# Patient Record
Sex: Female | Born: 2000 | Race: Black or African American | Marital: Single | Smoking: Never smoker
Health system: Southern US, Community
[De-identification: ages and names within clinical notes are randomized; demographics above are authoritative.]

---
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
## 2020-02-22 ENCOUNTER — Ambulatory Visit: Payer: Self-pay | Admitting: Family Medicine

## 2020-02-25 ENCOUNTER — Ambulatory Visit: Payer: Self-pay | Admitting: Family Medicine

## 2020-02-29 ENCOUNTER — Ambulatory Visit: Payer: Self-pay

## 2020-02-29 ENCOUNTER — Other Ambulatory Visit: Payer: Self-pay

## 2020-02-29 ENCOUNTER — Encounter: Payer: Self-pay | Admitting: Family Medicine

## 2020-02-29 ENCOUNTER — Ambulatory Visit (INDEPENDENT_AMBULATORY_CARE_PROVIDER_SITE_OTHER): Payer: Commercial Managed Care - PPO | Admitting: Family Medicine

## 2020-02-29 DIAGNOSIS — G8929 Other chronic pain: Secondary | ICD-10-CM

## 2020-02-29 DIAGNOSIS — M25562 Pain in left knee: Secondary | ICD-10-CM

## 2020-02-29 DIAGNOSIS — M25561 Pain in right knee: Secondary | ICD-10-CM | POA: Diagnosis not present

## 2020-02-29 MED ORDER — MELOXICAM 15 MG PO TABS: 7.5000 mg | ORAL_TABLET | Freq: Every day | ORAL | 6 refills | Status: DC | PRN

## 2020-02-29 NOTE — Progress Notes (Signed)
No known injury Pain for a long time Use to play sports Started hurting when working at [employer] with PT but still hurting  Pain is getting worse

## 2020-02-29 NOTE — Progress Notes (Signed)
   Office Visit Note   Patient: Cheryl Shaw           Date of Birth: 31-May-2001           MRN: [id number] Visit Date: 02/29/2020 Requested by: No referring provider defined for this encounter. PCP: No primary care provider on file.  Subjective: Chief Complaint  Patient presents with  . Right Knee - Pain  . Left Knee - Pain    HPI: She is here with bilateral knee pain.  Symptoms started years ago.  She has always been active playing sports, she presumed that it was not normal to have knee pain during sports.  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  Even though she is not active in sports, her knees continue to bother her.  [place] is originally from [place] and over the summer she went to physical therapy for a few months.  Her pain improved but it did not go away completely.  Now both knees are bothering her again.  Her PCP gave her an anti-inflammatory for a month and that gave her temporary relief.  Both of her knees hurt about the same, across the front of the knee.  No locking or giving way, no sensation of instability.  She is studying biology in hopes of either going to medical school or nursing school.                ROS:   All other systems were reviewed and are negative.  Objective: Vital Signs: There were no vitals taken for this visit.  Physical Exam:  General:  Alert and oriented, in no acute distress. Pulm:  Breathing unlabored. Psy:  Normal mood, congruent affect. Skin: No rash Knees: She has 1+ patellofemoral crepitus on the left, none on the right.  No effusion in either knee.  Bilateral wide Q angles with slight pain on patellar compression.  Lachman's is solid bilaterally, no laxity with varus or valgus stress, no significant joint line tenderness and no pain or click with McMurray's.  Imaging: XR Knee 1-2 Views Left  Result Date: 02/29/2020 X-rays of the knees reveal normal bony anatomy, close growth plates, no sign of stress fracture, loose body,  OCD.  XR Knee 1-2 Views Right  Result Date: 02/29/2020 X-rays of the knees reveal normal bony anatomy, close growth plates, no sign of stress fracture, loose body, OCD.   Assessment & Plan: 1.  Chronic bilateral knee pain, question chondromalacia patella. -She has continued to have pain despite adequate conservative treatment.  We will proceed with MRI scan of both knees.  Depending on the results, could consider different bracing, resumption of physical therapy, possibly injections such as dextrose prolotherapy or gel injections.     Procedures: No procedures performed  No notes on file     PMFS History: There are no problems to display for this patient.  History reviewed. No pertinent past medical history.  History reviewed. No pertinent family history.  History reviewed. No pertinent surgical history. Social History   Occupational History  . Not on file  Tobacco Use  . Smoking status: Not on file  Substance and Sexual Activity  . Alcohol use: Not on file  . Drug use: Not on file  . Sexual activity: Not on file

## 2020-03-17 ENCOUNTER — Other Ambulatory Visit: Payer: Self-pay | Admitting: Family Medicine

## 2020-03-17 ENCOUNTER — Encounter: Payer: Self-pay | Admitting: Family Medicine

## 2020-03-22 ENCOUNTER — Other Ambulatory Visit: Payer: BLUE CROSS/BLUE SHIELD

## 2020-04-12 ENCOUNTER — Ambulatory Visit
Admission: RE | Admit: 2020-04-12 | Discharge: 2020-04-12 | Disposition: A | Discharge status: Home / Self Care | Payer: BC Managed Care – PPO | Source: Ambulatory Visit | Attending: Family Medicine | Admitting: Family Medicine

## 2020-04-12 ENCOUNTER — Other Ambulatory Visit: Payer: Self-pay

## 2020-04-12 DIAGNOSIS — G8929 Other chronic pain: Secondary | ICD-10-CM

## 2020-04-12 DIAGNOSIS — M25562 Pain in left knee: Secondary | ICD-10-CM

## 2020-04-14 ENCOUNTER — Telehealth: Payer: Self-pay | Admitting: Family Medicine

## 2020-04-14 NOTE — Telephone Encounter (Signed)
MRI scans look good, no sign of internal damage that would need surgery at this point.    Could contemplate trying different braces during activity.  [email or web address]

## 2020-04-24 ENCOUNTER — Ambulatory Visit (INDEPENDENT_AMBULATORY_CARE_PROVIDER_SITE_OTHER): Payer: Commercial Managed Care - PPO

## 2020-04-24 ENCOUNTER — Other Ambulatory Visit: Payer: Self-pay

## 2020-04-24 DIAGNOSIS — M25562 Pain in left knee: Secondary | ICD-10-CM

## 2020-04-24 DIAGNOSIS — M25561 Pain in right knee: Secondary | ICD-10-CM

## 2020-04-24 DIAGNOSIS — G8929 Other chronic pain: Secondary | ICD-10-CM

## 2020-04-24 NOTE — Progress Notes (Signed)
Nurse visit only: fitted patient with bilateral pso braces, size large.

## 2020-07-29 ENCOUNTER — Encounter: Payer: Self-pay | Admitting: Family Medicine

## 2020-09-10 ENCOUNTER — Encounter: Payer: Self-pay | Admitting: Family Medicine

## 2020-09-12 ENCOUNTER — Ambulatory Visit: Payer: Commercial Managed Care - PPO | Admitting: Family Medicine

## 2021-01-24 ENCOUNTER — Ambulatory Visit (HOSPITAL_COMMUNITY)
Admission: EM | Admit: 2021-01-24 | Discharge: 2021-01-24 | Disposition: A | Discharge status: Home / Self Care | Payer: Commercial Managed Care - PPO | Attending: Emergency Medicine | Admitting: Emergency Medicine

## 2021-01-24 ENCOUNTER — Encounter (HOSPITAL_COMMUNITY): Payer: Self-pay | Admitting: Emergency Medicine

## 2021-01-24 DIAGNOSIS — N898 Other specified noninflammatory disorders of vagina: Secondary | ICD-10-CM | POA: Insufficient documentation

## 2021-01-24 MED ORDER — FLUCONAZOLE 150 MG PO TABS: 150.0000 mg | ORAL_TABLET | Freq: Every day | ORAL | 0 refills | Status: DC

## 2021-01-24 NOTE — ED Triage Notes (Signed)
Pt presents with vaginal irritation and discharge Xs 2 days.

## 2021-01-24 NOTE — ED Provider Notes (Signed)
[facility]    CSN: [id number] Arrival date & time: 01/24/21  1634      History   Chief Complaint Chief Complaint  Patient presents with   Vaginal Itching   Vaginal Discharge    HPI Cheryl Shaw is a 20 y.o. female.   Patient presents with vaginal itching/irritation with thick Hendry Speas clumpy discharge for 2 days.  Denies urinary frequency, urgency, flank pain, abdominal pain, fever, chills, hematuria, new lesions, rash, vaginal odor.  Sexually active, 1 partner, always condom use.  No known exposure.  Has not attempted treatment.  History reviewed. No pertinent past medical history.  There are no problems to display for this patient.   History reviewed. No pertinent surgical history.  OB History   No obstetric history on file.      Home Medications    Prior to Admission medications   Medication Sig Start Date End Date Taking? Authorizing Provider  fluconazole (DIFLUCAN) 150 MG tablet Take 1 tablet (150 mg total) by mouth daily. 01/24/21  Yes Costas Sena R, NP  LO LOESTRIN FE 1 MG-10 MCG / 10 MCG tablet Take 1 tablet by mouth daily. 09/07/19   [provider]  meloxicam (MOBIC) 15 MG tablet Take 0.5-1 tablets (7.5-15 mg total) by mouth daily as needed for pain. 02/29/20   Hilts, Casimiro Needle, MD    Family History History reviewed. No pertinent family history.  Social History Social History   Tobacco Use   Smoking status: Never   Smokeless tobacco: Never  Substance Use Topics   Alcohol use: Not Currently   Drug use: Never     Allergies   Patient has no allergy information on record.   Review of Systems Review of Systems  Constitutional: Negative.   Respiratory: Negative.    Cardiovascular: Negative.   Genitourinary:  Positive for vaginal discharge. Negative for decreased urine volume, difficulty urinating, dyspareunia, dysuria, enuresis, flank pain, frequency, genital sores, hematuria, menstrual problem, pelvic pain, urgency, vaginal  bleeding and vaginal pain.  Skin: Negative.     Physical Exam Triage Vital Signs ED Triage Vitals [01/24/21 1652]  Enc Vitals Group     BP 116/60     Pulse Rate 60     Resp 16     Temp 98.7 F (37.1 C)     Temp Source Oral     SpO2 100 %     Weight      Height      Head Circumference      Peak Flow      Pain Score 0     Pain Loc      Pain Edu?      Excl. in GC?    No data found.  Updated Vital Signs BP 116/60 (BP Location: Right Arm)   Pulse 60   Temp 98.7 F (37.1 C) (Oral)   Resp 16   LMP 12/29/2020   SpO2 100%   Visual Acuity Right Eye Distance:   Left Eye Distance:   Bilateral Distance:    Right Eye Near:   Left Eye Near:    Bilateral Near:     Physical Exam Constitutional:      Appearance: Normal appearance. She is normal weight.  Eyes:     Extraocular Movements: Extraocular movements intact.  Pulmonary:     Effort: Pulmonary effort is normal.  Genitourinary:    Comments: Deferred self collected vaginal swab Neurological:     Mental Status: She is alert and oriented to person, place,  and time. Mental status is at baseline.  Psychiatric:        Mood and Affect: Mood normal.        Behavior: Behavior normal.     UC Treatments / Results  Labs (all labs ordered are listed, but only abnormal results are displayed) Labs Reviewed  CERVICOVAGINAL ANCILLARY ONLY    EKG   Radiology No results found.  Procedures Procedures (including critical care time)  Medications Ordered in UC Medications - No data to display  Initial Impression / Assessment and Plan / UC Course  I have reviewed the triage vital signs and the nursing notes.  Pertinent labs & imaging results that were available during my care of the patient were reviewed by me and considered in my medical decision making (see chart for details).  Vaginal discharge  Will treat prophylactically for possible yeast infection   1.  Diflucan 150 mg once, Diflucan 250 mg as needed in 72  hours 2.  STI screening pending, will treat per protocol, advised abstinence until all treatment complete and or symptoms are resolved Final Clinical Impressions(s) / UC Diagnoses   Final diagnoses:  Vaginal discharge     Discharge Instructions      Labs pending 2-3 days, you will be contacted if positive for any sti and treatment will be sent to the pharmacy, you will have to return to the clinic if positive for gonorrhea to receive treatment   Please refrain from having sex until labs results, if positive please refrain from having sex until treatment complete and symptoms resolve   If positive for Chlamydia  gonorrhea or trichomoniasis please notify partner or partners so they may tested as well  Moving forward, it is recommended you use some form of protection against the transmission of sti infections  such as condoms or dental dams with each sexual encounter     ED Prescriptions     Medication Sig Dispense Auth. Provider   fluconazole (DIFLUCAN) 150 MG tablet Take 1 tablet (150 mg total) by mouth daily. 2 tablet Valinda Hoar, NP      PDMP not reviewed this encounter.   Valinda Hoar, NP 01/24/21 1740

## 2021-01-24 NOTE — Discharge Instructions (Addendum)
Labs pending 2-3 days, you will be contacted if positive for any sti and treatment will be sent to the pharmacy, you will have to return to the clinic if positive for gonorrhea to receive treatment   Please refrain from having sex until labs results, if positive please refrain from having sex until treatment complete and symptoms resolve   If positive for  Chlamydia  gonorrhea or trichomoniasis please notify partner or partners so they may tested as well  Moving forward, it is recommended you use some form of protection against the transmission of sti infections  such as condoms or dental dams with each sexual encounter    

## 2021-01-26 LAB — CERVICOVAGINAL ANCILLARY ONLY
Bacterial Vaginitis (gardnerella): NEGATIVE
Candida Glabrata: NEGATIVE
Candida Vaginitis: POSITIVE — AB
Chlamydia: NEGATIVE
Comment: NEGATIVE
Comment: NEGATIVE
Comment: NEGATIVE
Comment: NEGATIVE
Comment: NEGATIVE
Comment: NORMAL
Neisseria Gonorrhea: NEGATIVE
Trichomonas: NEGATIVE

## 2021-02-15 ENCOUNTER — Encounter (HOSPITAL_COMMUNITY): Payer: Self-pay | Admitting: *Deleted

## 2021-02-15 ENCOUNTER — Other Ambulatory Visit: Payer: Self-pay

## 2021-02-15 ENCOUNTER — Ambulatory Visit (HOSPITAL_COMMUNITY)
Admission: EM | Admit: 2021-02-15 | Discharge: 2021-02-15 | Disposition: A | Discharge status: Home / Self Care | Payer: Commercial Managed Care - PPO | Attending: Emergency Medicine | Admitting: Emergency Medicine

## 2021-02-15 DIAGNOSIS — A084 Viral intestinal infection, unspecified: Secondary | ICD-10-CM | POA: Diagnosis not present

## 2021-02-15 MED ORDER — ONDANSETRON HCL 4 MG PO TABS: 4.0000 mg | ORAL_TABLET | Freq: Four times a day (QID) | ORAL | 0 refills | Status: DC

## 2021-02-15 MED ORDER — LOPERAMIDE HCL 2 MG PO CAPS: 2.0000 mg | ORAL_CAPSULE | Freq: Two times a day (BID) | ORAL | 0 refills | Status: DC

## 2021-02-15 NOTE — Discharge Instructions (Addendum)
NextYour symptoms today are most likely being caused by a virus meaning they will resolve on their own time and you can use medications to make the symptoms more manageable  You can use Zofran every 6 hours as needed to help with nausea, take a pill and then wait 30 minutes to an hour before attempting to eat  If unable to tolerate foods, increase fluid intake using water or electrolyte replacing supplements such as Gatorade or similar product to prevent dehydration  Can use Imodium twice a day to help with diarrhea, be mindful that overuse may have the opposite effect of constipation, I suggest taking 1 pill and see how your body reacts to it before continuing usage  At any point if your abdominal pain becomes severe, you are vomiting and diarrhea persist past use of medication, you become lightheaded or dizzy recommend that she go to the nearest emergency department for further evaluation of symptoms

## 2021-02-15 NOTE — ED Triage Notes (Signed)
Pt reports 3 days ago having N/V/D . Sx's got better and same Sx's started today.

## 2021-02-15 NOTE — ED Provider Notes (Signed)
[facility]    CSN: [id number] Arrival date & time: 02/15/21  1057      History   Chief Complaint Chief Complaint  Patient presents with   Emesis   Nausea   Diarrhea    HPI Cheryl Shaw is a 20 y.o. female.   Patient presents with nausea vomiting and diarrhea beginning 3 days ago, symptoms resolved yesterday but returned this morning.  Has not vomited twice today but no diarrhea.  Endorses lower abdominal pain described as sharp.  Has been able to eat and drink.  Denies fever, chills, body aches, URI symptoms, heartburn or indigestion, bloating, increased gas production.  No known sick contacts.  Denies changes in diet or recent travel.  History reviewed. No pertinent past medical history.  There are no problems to display for this patient.   History reviewed. No pertinent surgical history.  OB History   No obstetric history on file.      Home Medications    Prior to Admission medications   Medication Sig Start Date End Date Taking? Authorizing Provider  loperamide (IMODIUM) 2 MG capsule Take 1 capsule (2 mg total) by mouth in the morning and at bedtime. 02/15/21  Yes Jeremia Groot R, NP  ondansetron (ZOFRAN) 4 MG tablet Take 1 tablet (4 mg total) by mouth every 6 (six) hours. 02/15/21  Yes Mathilde Mcwherter R, NP  fluconazole (DIFLUCAN) 150 MG tablet Take 1 tablet (150 mg total) by mouth daily. 01/24/21   Santiel Topper R, NP  LO LOESTRIN FE 1 MG-10 MCG / 10 MCG tablet Take 1 tablet by mouth daily. 09/07/19   [provider]  meloxicam (MOBIC) 15 MG tablet Take 0.5-1 tablets (7.5-15 mg total) by mouth daily as needed for pain. 02/29/20   Hilts, Casimiro Needle, MD    Family History History reviewed. No pertinent family history.  Social History Social History   Tobacco Use   Smoking status: Never   Smokeless tobacco: Never  Substance Use Topics   Alcohol use: Not Currently   Drug use: Never     Allergies   Patient has no known  allergies.   Review of Systems Review of Systems  Constitutional: Negative.   Respiratory: Negative.    Cardiovascular: Negative.   Gastrointestinal:  Positive for abdominal pain, diarrhea, nausea and vomiting. Negative for abdominal distention, anal bleeding, blood in stool, constipation and rectal pain.  Neurological: Negative.     Physical Exam Triage Vital Signs ED Triage Vitals  Enc Vitals Group     BP 02/15/21 1219 123/78     Pulse Rate 02/15/21 1219 61     Resp 02/15/21 1219 18     Temp 02/15/21 1219 98.6 F (37 C)     Temp src --      SpO2 02/15/21 1219 99 %     Weight --      Height --      Head Circumference --      Peak Flow --      Pain Score 02/15/21 1222 5     Pain Loc --      Pain Edu? --      Excl. in GC? --    No data found.  Updated Vital Signs BP 123/78   Pulse 61   Temp 98.6 F (37 C)   Resp 18   LMP 01/29/2021 (Approximate)   SpO2 99%   Visual Acuity Right Eye Distance:   Left Eye Distance:   Bilateral Distance:  Right Eye Near:   Left Eye Near:    Bilateral Near:     Physical Exam Constitutional:      Appearance: Normal appearance. She is normal weight.  HENT:     Head: Normocephalic.  Eyes:     Extraocular Movements: Extraocular movements intact.  Pulmonary:     Effort: Pulmonary effort is normal.  Abdominal:     General: Abdomen is flat. Bowel sounds are normal.     Palpations: Abdomen is soft.     Tenderness: There is abdominal tenderness in the right lower quadrant, suprapubic area and left lower quadrant. There is no right CVA tenderness, left CVA tenderness or guarding. Negative signs include Murphy's sign and McBurney's sign.  Skin:    General: Skin is warm and dry.  Neurological:     Mental Status: She is alert and oriented to person, place, and time. Mental status is at baseline.  Psychiatric:        Mood and Affect: Mood normal.        Behavior: Behavior normal.     UC Treatments / Results  Labs (all labs  ordered are listed, but only abnormal results are displayed) Labs Reviewed - No data to display  EKG   Radiology No results found.  Procedures Procedures (including critical care time)  Medications Ordered in UC Medications - No data to display  Initial Impression / Assessment and Plan / UC Course  I have reviewed the triage vital signs and the nursing notes.  Pertinent labs & imaging results that were available during my care of the patient were reviewed by me and considered in my medical decision making (see chart for details).  Viral gastroenteritis  Etiology of symptoms most likely viral, low suspicion of bacterial involvement or true organ involvement, vital signs stable, patient in no signs of distress, will treat conservatively, discussed with patient, in agreement with plan of care, given strict return precautions for worsening signs of infection or increasing abdominal pain to go to nearest emergency department for further evaluation  1.Zofran 4 mg every 6 hours as needed 2.  Imodium 2 mg twice a day as needed 3.  Patient instructed to increase fluid intake using water or electrolyte replacement fluids to prevent dehydration while symptoms are present Final Clinical Impressions(s) / UC Diagnoses   Final diagnoses:  Viral gastroenteritis     Discharge Instructions      NextYour symptoms today are most likely being caused by a virus meaning they will resolve on their own time and you can use medications to make the symptoms more manageable  You can use Zofran every 6 hours as needed to help with nausea, take a pill and then wait 30 minutes to an hour before attempting to eat  If unable to tolerate foods, increase fluid intake using water or electrolyte replacing supplements such as Gatorade or similar product to prevent dehydration  Can use Imodium twice a day to help with diarrhea, be mindful that overuse may have the opposite effect of constipation, I suggest  taking 1 pill and see how your body reacts to it before continuing usage  At any point if your abdominal pain becomes severe, you are vomiting and diarrhea persist past use of medication, you become lightheaded or dizzy recommend that she go to the nearest emergency department for further evaluation of symptoms   ED Prescriptions     Medication Sig Dispense Auth. Provider   ondansetron (ZOFRAN) 4 MG tablet Take 1 tablet (4 mg  total) by mouth every 6 (six) hours. 12 tablet Michale Emmerich R, NP   loperamide (IMODIUM) 2 MG capsule Take 1 capsule (2 mg total) by mouth in the morning and at bedtime. 12 capsule Valinda Hoar, NP      PDMP not reviewed this encounter.   Valinda Hoar, NP 02/15/21 1238

## 2021-03-27 ENCOUNTER — Ambulatory Visit: Payer: Commercial Managed Care - PPO

## 2021-04-06 ENCOUNTER — Ambulatory Visit: Payer: Commercial Managed Care - PPO | Admitting: Orthopedic Surgery

## 2021-04-13 ENCOUNTER — Ambulatory Visit: Payer: Commercial Managed Care - PPO | Admitting: Orthopaedic Surgery

## 2022-04-01 ENCOUNTER — Ambulatory Visit
Admission: RE | Admit: 2022-04-01 | Discharge: 2022-04-01 | Disposition: A | Discharge status: Home / Self Care | Payer: BC Managed Care – PPO | Source: Ambulatory Visit | Attending: Emergency Medicine | Admitting: Emergency Medicine

## 2022-04-01 VITALS — BP 131/69 | HR 62 | Temp 98.4°F | Resp 18

## 2022-04-01 DIAGNOSIS — Z113 Encounter for screening for infections with a predominantly sexual mode of transmission: Secondary | ICD-10-CM | POA: Insufficient documentation

## 2022-04-01 DIAGNOSIS — N760 Acute vaginitis: Secondary | ICD-10-CM | POA: Diagnosis present

## 2022-04-01 LAB — POCT URINALYSIS DIP (MANUAL ENTRY)
Bilirubin, UA: NEGATIVE
Blood, UA: NEGATIVE
Glucose, UA: NEGATIVE mg/dL
Ketones, POC UA: NEGATIVE mg/dL
Leukocytes, UA: NEGATIVE
Nitrite, UA: NEGATIVE
Protein Ur, POC: 30 mg/dL — AB
Spec Grav, UA: 1.02 (ref 1.010–1.025)
Urobilinogen, UA: 0.2 E.U./dL
pH, UA: 7.5 (ref 5.0–8.0)

## 2022-04-01 LAB — POCT URINE PREGNANCY: Preg Test, Ur: NEGATIVE

## 2022-04-01 MED ORDER — TERCONAZOLE 0.4 % VA CREA: TOPICAL_CREAM | VAGINAL | 0 refills | Status: DC

## 2022-04-01 NOTE — ED Triage Notes (Signed)
Patient with "problems in her lady area". States she is itchy and burning when see pees.

## 2022-04-01 NOTE — ED Provider Notes (Signed)
[facility]    CSN: [id number] Arrival date & time: 04/01/22  1812    HISTORY   Chief Complaint  Patient presents with   Vaginal Itching    Entered by patient   HPI Cheryl Shaw is a pleasant, 21 y.o. female who presents to urgent care today. Patient c/o vaginal itching and vaginal burning when see urinates. Patient endorses vaginal itching and irritation.  Patient denies abnormal odor of urine, burning with urination, blood in urine, increased frequency of urination, increased urge to urinate, sensation of incomplete emptying, suprapubic pain, perineal pain, flank pain, fever, chills, malaise, rigors, significant fatigue, abnormal vaginal discharge, dyspareunia, genital lesion(s), and possible exposure to STD.     The history is provided by the patient.   History reviewed. No pertinent past medical history. There are no problems to display for this patient.  History reviewed. No pertinent surgical history. OB History   No obstetric history on file.    Home Medications    Prior to Admission medications   Medication Sig Start Date End Date Taking? Authorizing Provider  fluconazole (DIFLUCAN) 150 MG tablet Take 1 tablet (150 mg total) by mouth daily. 01/24/21   White, Adrienne R, NP  LO LOESTRIN FE 1 MG-10 MCG / 10 MCG tablet Take 1 tablet by mouth daily. 09/07/19   [provider]  loperamide (IMODIUM) 2 MG capsule Take 1 capsule (2 mg total) by mouth in the morning and at bedtime. 02/15/21   White, Elita Boone, NP  meloxicam (MOBIC) 15 MG tablet Take 0.5-1 tablets (7.5-15 mg total) by mouth daily as needed for pain. 02/29/20   Hilts, Casimiro Needle, MD  ondansetron (ZOFRAN) 4 MG tablet Take 1 tablet (4 mg total) by mouth every 6 (six) hours. 02/15/21   Valinda Hoar, NP    Family History No family history on file. Social History Social History   Tobacco Use   Smoking status: Never   Smokeless tobacco: Never  Substance Use Topics   Alcohol use: Not  Currently   Drug use: Never   Allergies   Patient has no known allergies.  Review of Systems Review of Systems Pertinent findings revealed after performing a 14 point review of systems has been noted in the history of present illness.  Physical Exam Triage Vital Signs ED Triage Vitals  Enc Vitals Group     BP 04/03/21 0827 (!) 147/82     Pulse Rate 04/03/21 0827 72     Resp 04/03/21 0827 18     Temp 04/03/21 0827 98.3 F (36.8 C)     Temp Source 04/03/21 0827 Oral     SpO2 04/03/21 0827 98 %     Weight --      Height --      Head Circumference --      Peak Flow --      Pain Score 04/03/21 0826 5     Pain Loc --      Pain Edu? --      Excl. in GC? --   No data found.  Updated Vital Signs BP 131/69 (BP Location: Left Arm)   Pulse 62   Temp 98.4 F (36.9 C) (Oral)   Resp 18   LMP 02/16/2022 (Approximate)   SpO2 99%   Physical Exam Vitals and nursing note reviewed.  Constitutional:      General: She is not in acute distress.    Appearance: Normal appearance. She is not ill-appearing.  HENT:     Head:  Normocephalic and atraumatic.  Eyes:     General: Lids are normal.        Right eye: No discharge.        Left eye: No discharge.     Extraocular Movements: Extraocular movements intact.     Conjunctiva/sclera: Conjunctivae normal.     Right eye: Right conjunctiva is not injected.     Left eye: Left conjunctiva is not injected.  Neck:     Trachea: Trachea and phonation normal.  Cardiovascular:     Rate and Rhythm: Normal rate and regular rhythm.     Pulses: Normal pulses.     Heart sounds: Normal heart sounds. No murmur heard.    No friction rub. No gallop.  Pulmonary:     Effort: Pulmonary effort is normal. No accessory muscle usage, prolonged expiration or respiratory distress.     Breath sounds: Normal breath sounds. No stridor, decreased air movement or transmitted upper airway sounds. No decreased breath sounds, wheezing, rhonchi or rales.  Chest:      Chest wall: No tenderness.  Genitourinary:    Comments: Patient politely declines pelvic exam today, patient provided a vaginal swab for testing. Musculoskeletal:        General: Normal range of motion.     Cervical back: Normal range of motion and neck supple. Normal range of motion.  Lymphadenopathy:     Cervical: No cervical adenopathy.  Skin:    General: Skin is warm and dry.     Findings: No erythema or rash.  Neurological:     General: No focal deficit present.     Mental Status: She is alert and oriented to person, place, and time.  Psychiatric:        Mood and Affect: Mood normal.        Behavior: Behavior normal.     Visual Acuity Right Eye Distance:   Left Eye Distance:   Bilateral Distance:    Right Eye Near:   Left Eye Near:    Bilateral Near:     UC Couse / Diagnostics / Procedures:     Radiology No results found.  Procedures Procedures (including critical care time) EKG  Pending results:  Labs Reviewed  POCT URINALYSIS DIP (MANUAL ENTRY) - Abnormal; Notable for the following components:      Result Value   Protein Ur, POC =30 (*)    All other components within normal limits  POCT URINE PREGNANCY  CERVICOVAGINAL ANCILLARY ONLY    Medications Ordered in UC: Medications - No data to display  UC Diagnoses / Final Clinical Impressions(s)   I have reviewed the triage vital signs and the nursing notes.  Pertinent labs & imaging results that were available during my care of the patient were reviewed by me and considered in my medical decision making (see chart for details).    Final diagnoses:  Vaginitis and vulvovaginitis    Patient was provided with Terconazole cream applied 3-4 x daily as needed for treatment of vulvovaginal irritation based on the history provided to me today.   STD screening was performed, patient advised that the results be posted to their MyChart and if any of the results are positive, they will be notified by phone, further  treatment will be provided as indicated based on results of STD screening. Patient was advised to abstain from sexual intercourse until that they receive the results of their STD testing.  Patient was also advised to use condoms to protect themselves from STD exposure. Urinalysis  today was normal. Urine pregnancy test was negative. Return precautions advised.  Drug allergies reviewed, all questions addressed.     ED Prescriptions     Medication Sig Dispense Auth. Provider   terconazole (TERAZOL 7) 0.4 % vaginal cream Apply twice daily to vulvovaginal area, can reapply after every void, use for 7 days as needed 45 g Lynden Oxford Scales, PA-C      PDMP not reviewed this encounter.  Disposition Upon Discharge:  Condition: stable for discharge home  Patient presented with concern for an acute illness with associated systemic symptoms and significant discomfort requiring urgent management. In my opinion, this is a condition that a prudent lay person (someone who possesses an average knowledge of health and medicine) may potentially expect to result in complications if not addressed urgently such as respiratory distress, impairment of bodily function or dysfunction of bodily organs.   As such, the patient has been evaluated and assessed, work-up was performed and treatment was provided in alignment with urgent care protocols and evidence based medicine.  Patient/parent/caregiver has been advised that the patient may require follow up for further testing and/or treatment if the symptoms continue in spite of treatment, as clinically indicated and appropriate.  Routine symptom specific, illness specific and/or disease specific instructions were discussed with the patient and/or caregiver at length.  Prevention strategies for avoiding STD exposure were also discussed.  The patient will follow up with their current PCP if and as advised. If the patient does not currently have a PCP we will assist  them in obtaining one.   The patient may need specialty follow up if the symptoms continue, in spite of conservative treatment and management, for further workup, evaluation, consultation and treatment as clinically indicated and appropriate.  Patient/parent/caregiver verbalized understanding and agreement of plan as discussed.  All questions were addressed during visit.  Please see discharge instructions below for further details of plan.  Discharge Instructions:   Discharge Instructions      The results of your vaginal swab test which screens for BV, yeast, gonorrhea, chlamydia and trichomonas will be made posted to your MyChart account once it is complete.  This typically takes 2 to 4 days.  Please abstain from sexual intercourse of any kind, vaginal, oral or anal, until you have received the results of your STD testing.     If any of your results are abnormal, you will receive a phone call regarding treatment.  Prescriptions, if any are needed, will be provided for you at your pharmacy.     For comfort, while you are waiting for the result of your vaginal swab test, I have provided you with an external yeast infection cream that you can apply 3-4 times daily for relief of vaginal itching and burning.  I recommend that you abstain from sexual intercourse, tampon use or any other other intravaginal activities while you are waiting on these results and possible treatment.   Your urine pregnancy test today is negative.   Our point-of-care analysis of your urine sample today was normal and did not reveal any concern for urinary tract infection.   If you have not had complete resolution of your symptoms after completing any needed treatment, please return for repeat evaluation.   Thank you for visiting urgent care today.  I appreciate the opportunity to participate in your care.       This office note has been dictated using Museum/gallery curator.  Unfortunately, this method  of dictation can sometimes lead  to typographical or grammatical errors.  I apologize for your inconvenience in advance if this occurs.  Please do not hesitate to reach out to me if clarification is needed.       Theadora Rama Scales, PA-C 04/01/22 1845

## 2022-04-01 NOTE — Discharge Instructions (Signed)
The results of your vaginal swab test which screens for BV, yeast, gonorrhea, chlamydia and trichomonas will be made posted to your MyChart account once it is complete.  This typically takes 2 to 4 days.  Please abstain from sexual intercourse of any kind, vaginal, oral or anal, until you have received the results of your STD testing.     If any of your results are abnormal, you will receive a phone call regarding treatment.  Prescriptions, if any are needed, will be provided for you at your pharmacy.     For comfort, while you are waiting for the result of your vaginal swab test, I have provided you with an external yeast infection cream that you can apply 3-4 times daily for relief of vaginal itching and burning.  I recommend that you abstain from sexual intercourse, tampon use or any other other intravaginal activities while you are waiting on these results and possible treatment.   Your urine pregnancy test today is negative.   Our point-of-care analysis of your urine sample today was normal and did not reveal any concern for urinary tract infection.   If you have not had complete resolution of your symptoms after completing any needed treatment, please return for repeat evaluation.   Thank you for visiting urgent care today.  I appreciate the opportunity to participate in your care.  

## 2022-04-05 LAB — CERVICOVAGINAL ANCILLARY ONLY
Bacterial Vaginitis (gardnerella): POSITIVE — AB
Candida Glabrata: NEGATIVE
Candida Vaginitis: POSITIVE — AB
Chlamydia: NEGATIVE
Comment: NEGATIVE
Comment: NEGATIVE
Comment: NEGATIVE
Comment: NEGATIVE
Comment: NEGATIVE
Comment: NORMAL
Neisseria Gonorrhea: NEGATIVE
Trichomonas: NEGATIVE

## 2022-04-06 ENCOUNTER — Telehealth (HOSPITAL_COMMUNITY): Payer: Self-pay | Admitting: Emergency Medicine

## 2022-04-06 MED ORDER — FLUCONAZOLE 150 MG PO TABS: 150.0000 mg | ORAL_TABLET | Freq: Once | ORAL | 0 refills | Status: AC

## 2022-04-06 MED ORDER — METRONIDAZOLE 500 MG PO TABS: 500.0000 mg | ORAL_TABLET | Freq: Two times a day (BID) | ORAL | 0 refills | Status: DC

## 2022-07-10 ENCOUNTER — Ambulatory Visit
Admission: RE | Admit: 2022-07-10 | Discharge: 2022-07-10 | Disposition: A | Discharge status: Home / Self Care | Payer: BC Managed Care – PPO | Source: Ambulatory Visit | Attending: Urgent Care | Admitting: Urgent Care

## 2022-07-10 VITALS — BP 152/78 | HR 76 | Temp 99.1°F | Resp 16

## 2022-07-10 DIAGNOSIS — H9202 Otalgia, left ear: Secondary | ICD-10-CM | POA: Diagnosis not present

## 2022-07-10 DIAGNOSIS — L729 Follicular cyst of the skin and subcutaneous tissue, unspecified: Secondary | ICD-10-CM | POA: Diagnosis not present

## 2022-07-10 DIAGNOSIS — L089 Local infection of the skin and subcutaneous tissue, unspecified: Secondary | ICD-10-CM | POA: Diagnosis not present

## 2022-07-10 MED ORDER — DOXYCYCLINE HYCLATE 100 MG PO CAPS: 100.0000 mg | ORAL_CAPSULE | Freq: Two times a day (BID) | ORAL | 0 refills | Status: DC

## 2022-07-10 MED ORDER — NAPROXEN 500 MG PO TABS: 500.0000 mg | ORAL_TABLET | Freq: Two times a day (BID) | ORAL | 0 refills | Status: DC

## 2022-07-10 NOTE — ED Provider Notes (Signed)
[facility]  Note:  This document was prepared using Systems analyst and may include unintentional dictation errors.  MRN: [id number] DOB: 01/23/2001  Subjective:   Cheryl Shaw is a 22 y.o. female presenting for 1 week history of persistent and worsening left ear swelling with pain.  Felt like it started to drain some 3 days ago but is no longer draining.  Patient wears an ear piece/headset for work.  No other inciting factors.  No current facility-administered medications for this encounter.  Current Outpatient Medications:    LO LOESTRIN FE 1 MG-10 MCG / 10 MCG tablet, Take 1 tablet by mouth daily., Disp: , Rfl:    metroNIDAZOLE (FLAGYL) 500 MG tablet, Take 1 tablet (500 mg total) by mouth 2 (two) times daily., Disp: 14 tablet, Rfl: 0   sertraline (ZOLOFT) 50 MG tablet, Take 50 mg by mouth daily., Disp: , Rfl:    terconazole (TERAZOL 7) 0.4 % vaginal cream, Apply twice daily to vulvovaginal area, can reapply after every void, use for 7 days as needed, Disp: 45 g, Rfl: 0   No Known Allergies  History reviewed. No pertinent past medical history.   History reviewed. No pertinent surgical history.  No family history on file.  Social History   Tobacco Use   Smoking status: Never   Smokeless tobacco: Never  Vaping Use   Vaping Use: Never used  Substance Use Topics   Alcohol use: Yes    Comment: occ   Drug use: Never    ROS   Objective:   Vitals: BP (!) 152/78 (BP Location: Right Arm)   Pulse 76   Temp 99.1 F (37.3 C) (Oral)   Resp 16   SpO2 98%   Physical Exam Constitutional:      General: She is not in acute distress.    Appearance: Normal appearance. She is well-developed. She is not ill-appearing, toxic-appearing or diaphoretic.  HENT:     Head: Normocephalic and atraumatic.     Right Ear: Tympanic membrane, ear canal and external ear normal. No tenderness. There is no impacted cerumen. Tympanic membrane is not  injected, perforated, erythematous or bulging.     Left Ear: Tympanic membrane, ear canal and external ear normal. No tenderness. There is no impacted cerumen. Tympanic membrane is not injected, perforated, erythematous or bulging.     Ears:      Nose: Nose normal.     Mouth/Throat:     Mouth: Mucous membranes are moist.  Eyes:     General: No scleral icterus.       Right eye: No discharge.        Left eye: No discharge.     Extraocular Movements: Extraocular movements intact.  Cardiovascular:     Rate and Rhythm: Normal rate.  Pulmonary:     Effort: Pulmonary effort is normal.  Skin:    General: Skin is warm and dry.  Neurological:     General: No focal deficit present.     Mental Status: She is alert and oriented to person, place, and time.  Psychiatric:        Mood and Affect: Mood normal.        Behavior: Behavior normal.    PROCEDURE NOTE: I&D of Abscess Verbal consent obtained. Local anesthesia with 1cc of 1% lidocaine without epinephrine. Site cleansed with Betadine. Incision of 1/2cm was made using an 11 blade, 2cc expressed consisting of a mixture of pus and serosanguinous fluid.  Cyst  excised in parts.  Wound cavity was explored with curved hemostats and loculations loosened. Cleansed and dressed.   Assessment and Plan :   PDMP not reviewed this encounter.  1. Infected cyst of skin   2. Left ear pain     Successful I&D performed.  Wound care reviewed.  Start doxycycline for the infected cyst, naproxen for pain and inflammation. Counseled patient on potential for adverse effects with medications prescribed/recommended today, ER and return-to-clinic precautions discussed, patient verbalized understanding.    Jaynee Eagles, [place] 07/11/22 [phone]

## 2022-07-10 NOTE — Discharge Instructions (Signed)
Please change your dressing 3-5 times daily. Do not apply any ointments or creams. Each time you change your dressing, make sure that you are pressing on the wound to get pus to come out.  Try your best to clean the wound with antibacterial soap and warm water. Pat your wound dry and let it air out if possible to make sure it is dry before reapplying another dressing.   Start doxycycline for the infection. Use naproxen for the pain.   

## 2022-07-10 NOTE — ED Triage Notes (Signed)
Pt c/o bump to inner left ear first noticed 1/25- pt c/o drainage from bump 3 days ago-NAD-steady gait

## 2022-07-22 IMAGING — MR MR KNEE*L* W/O CM
4 of 7 series · 23 of 40 positions shown · non-contrast
Comparison: Left knee x-rays dated February 29, 2020.

CLINICAL DATA: Chronic left knee pain.  No prior surgery.

EXAM:
MRI OF THE LEFT KNEE WITHOUT CONTRAST
TECHNIQUE: Multiplanar, multisequence MR imaging of the knee was performed. No
intravenous contrast was administered.

[Series 3: T2 fat-sat · axial · 4.0mm · 0.50mm/px · z∈[-51,+64]mm · 6 of 24 slices shown]
[im 1/24]
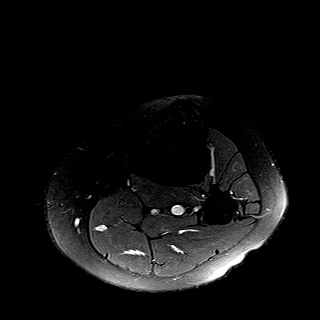
[im 5/24]
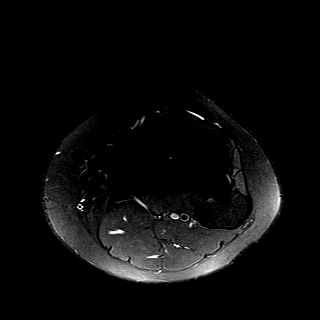
[im 10/24]
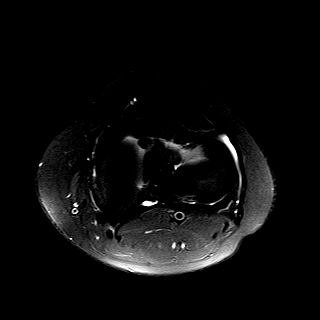
[im 14/24]
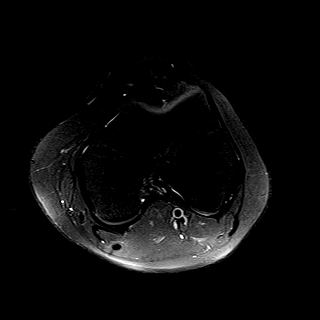
[im 19/24]
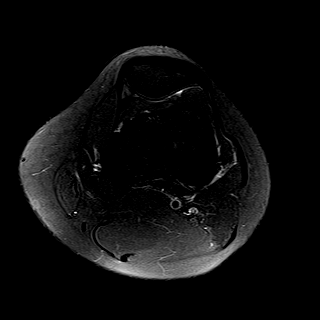
[im 24/24]
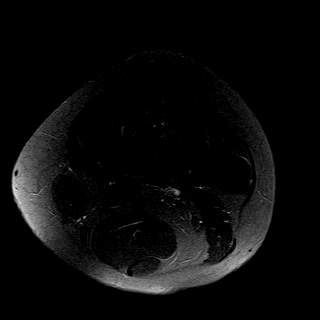

[Series 6: PD fat-sat · coronal · 3.0mm · 0.29mm/px · 7 of 28 slices shown (1 of 3)]
[im 1/28]
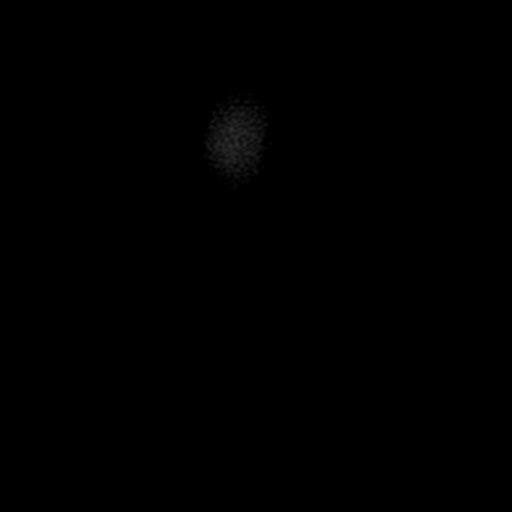
[im 5/28]
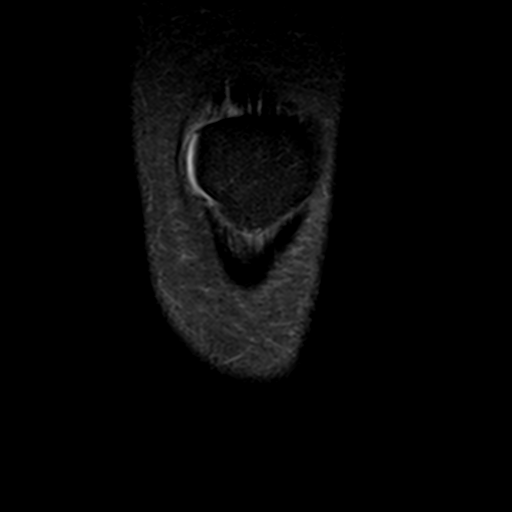
[im 10/28]
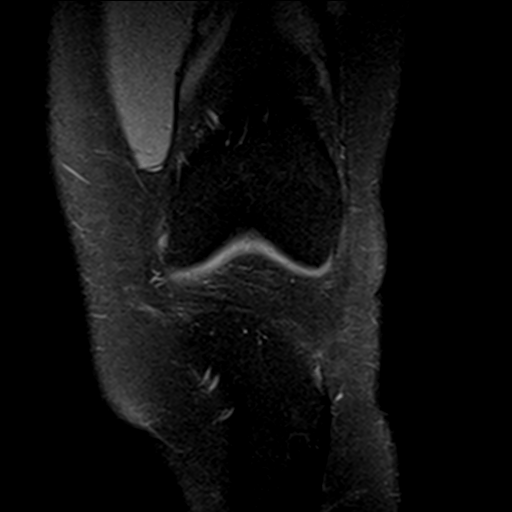
[im 14/28]
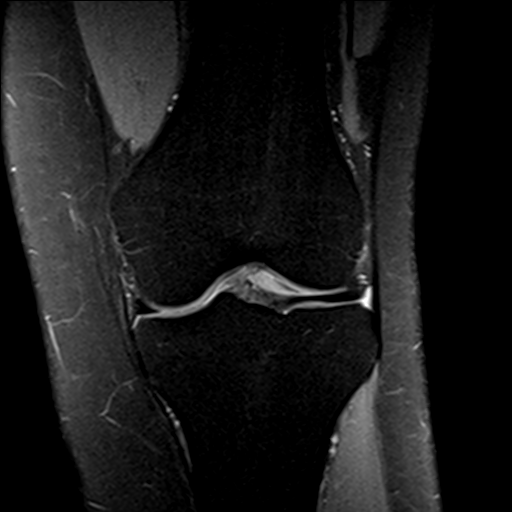
[im 19/28]
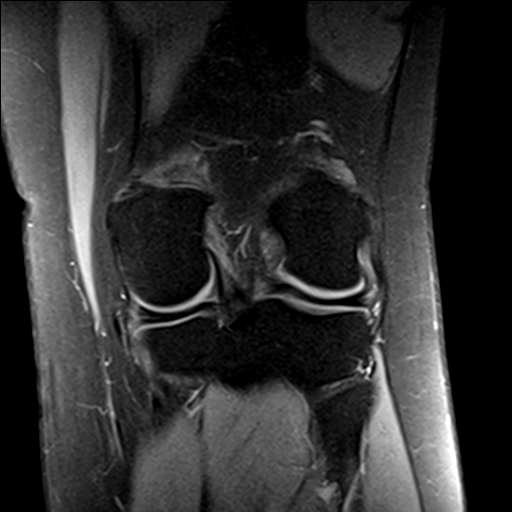
[im 23/28]
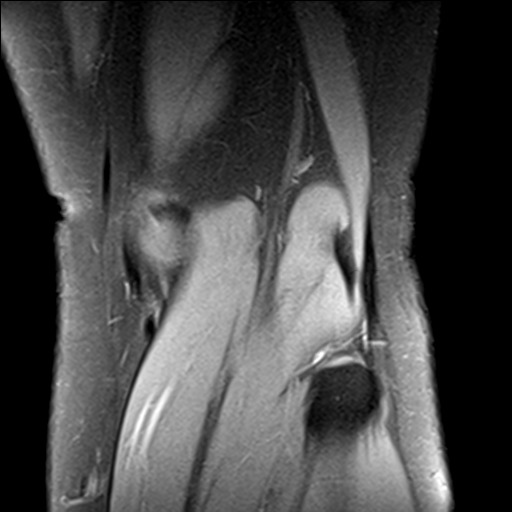
[im 28/28]
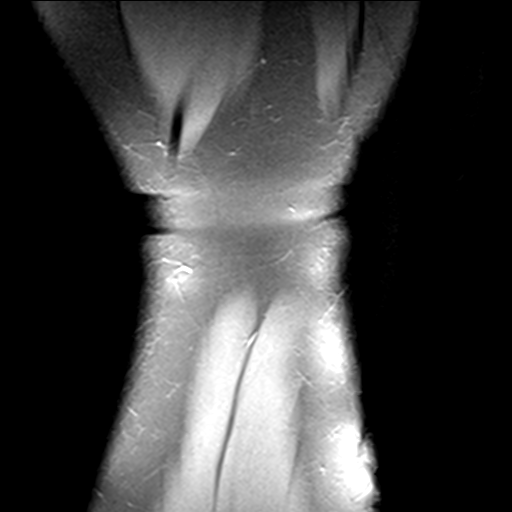

[Series 8: PD fat-sat · sagittal · 3.0mm · 0.29mm/px · 7 of 28 slices shown (2 of 3)]
[im 1/28]
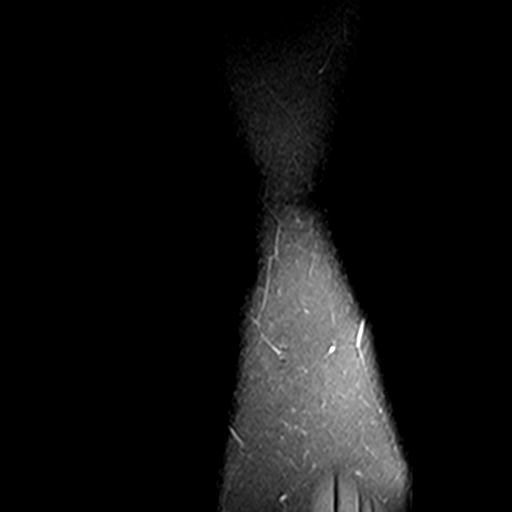
[im 5/28]
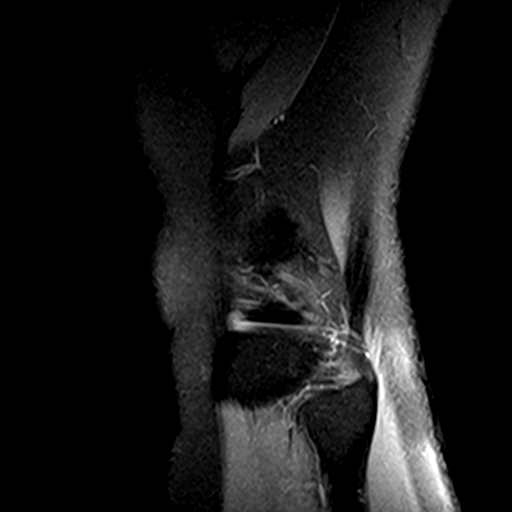
[im 10/28]
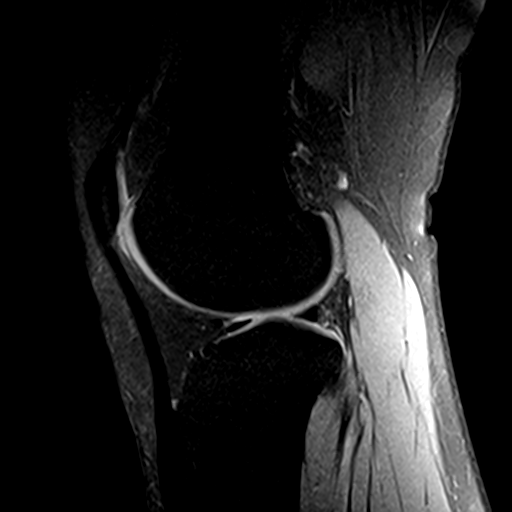
[im 14/28]
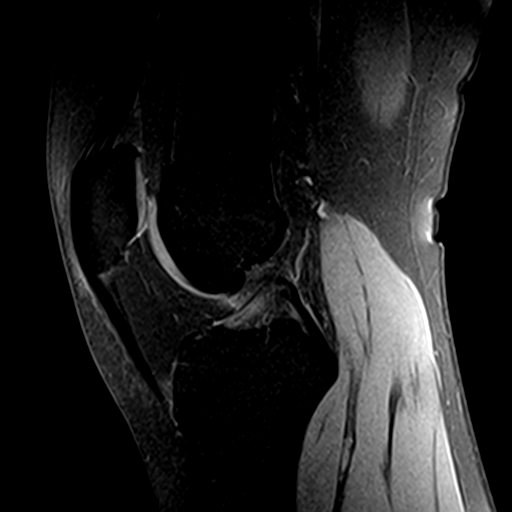
[im 19/28]
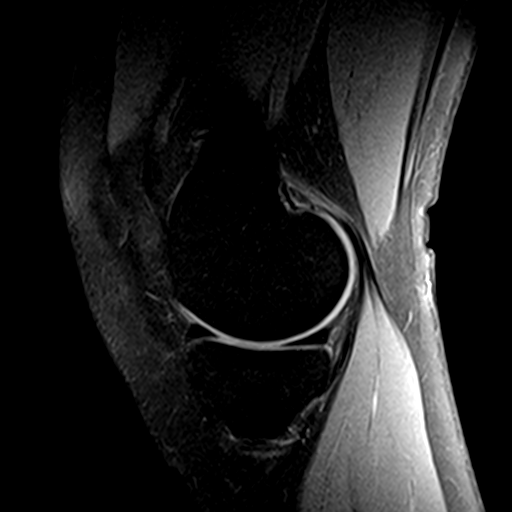
[im 23/28]
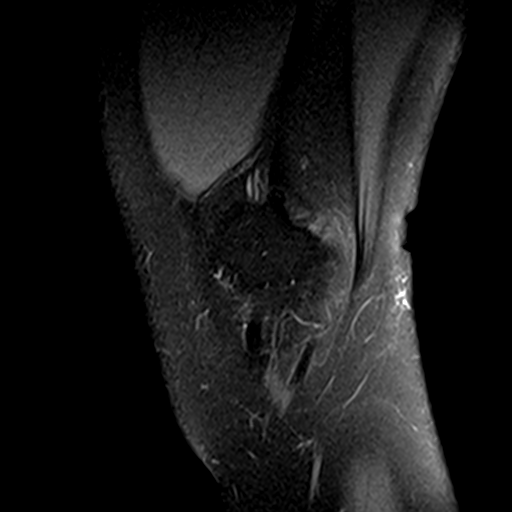
[im 28/28]
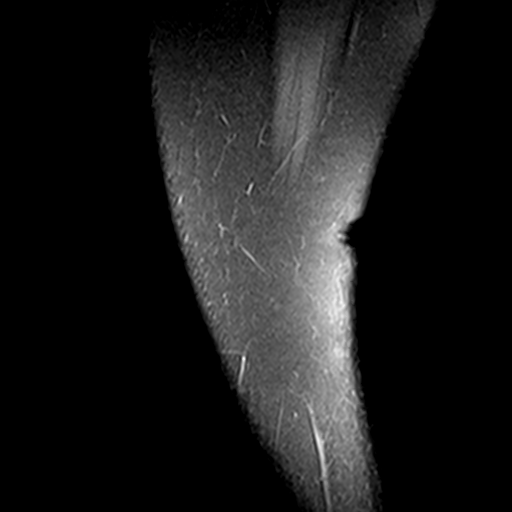

[Series 9: PD fat-sat · oblique · 2.3mm · 0.29mm/px · 3 of 11 slices shown (3 of 3)]
[im 1/11]
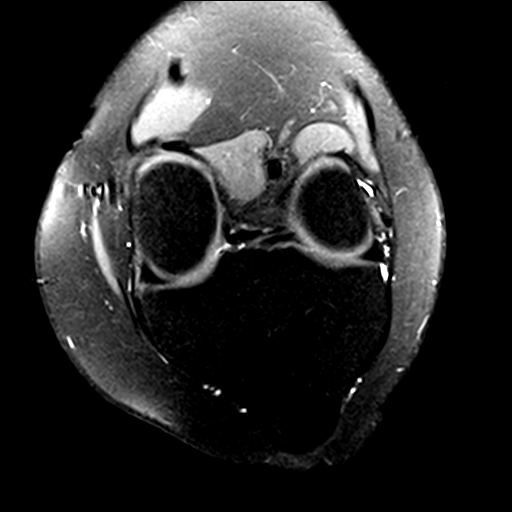
[im 6/11]
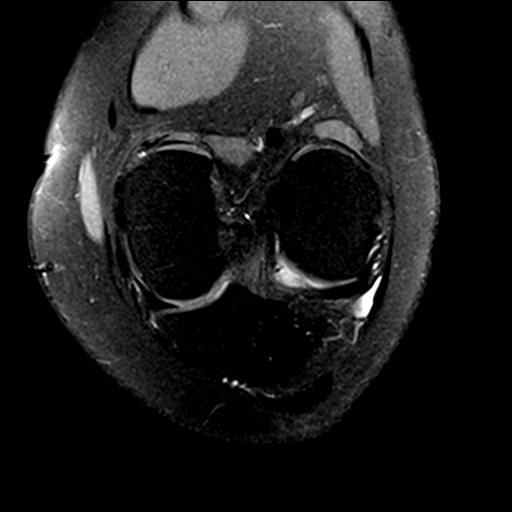
[im 11/11]
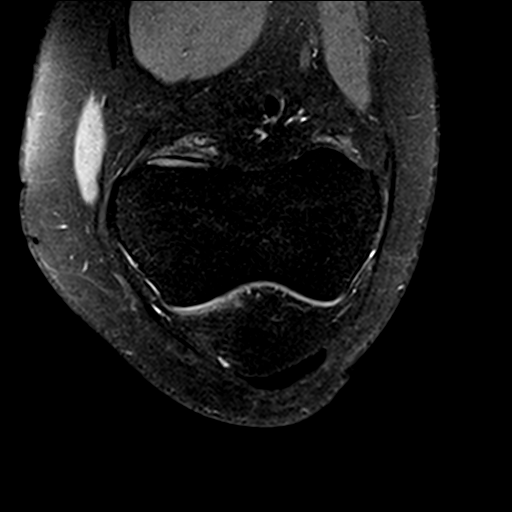

[23 of 40 positions shown; findings below may reference images not displayed]

FINDINGS: MENISCI

Medial meniscus:  Intact.

Lateral meniscus:  Intact.

LIGAMENTS

Cruciates:  Intact ACL and PCL.

Collaterals: Medial collateral ligament is intact. Lateral
collateral ligament complex is intact.

CARTILAGE

Patellofemoral:  Normal.

Medial:  Normal.

Lateral:  Normal.

Joint: No joint effusion. Minimal edema in the superolateral aspect
of Hoffa's fat.

Popliteal Fossa:  No Baker cyst. Intact popliteus tendon.

Extensor Mechanism: Intact quadriceps tendon and patellar tendon.
Intact medial and lateral patellar retinaculum. Intact MPFL.

Bones: No focal marrow signal abnormality. No fracture or
dislocation.

Other: None.
IMPRESSION: 1. Minimal edema in the superolateral aspect of Hoffa's fat pad,
which can be seen in the setting of patellar tendon-lateral femoral
condyle friction syndrome.
2. No evidence of internal derangement.

## 2022-07-29 HISTORY — PX: WISDOM TOOTH EXTRACTION: SHX21

## 2022-08-02 ENCOUNTER — Ambulatory Visit: Payer: BC Managed Care – PPO | Admitting: Family Medicine

## 2022-08-02 ENCOUNTER — Encounter: Payer: Self-pay | Admitting: Family Medicine

## 2022-08-02 VITALS — BP 117/73 | HR 73 | Temp 98.3°F | Ht 65.0 in | Wt 160.8 lb

## 2022-08-02 DIAGNOSIS — Z7689 Persons encountering health services in other specified circumstances: Secondary | ICD-10-CM

## 2022-08-02 DIAGNOSIS — H9312 Tinnitus, left ear: Secondary | ICD-10-CM

## 2022-08-02 NOTE — Progress Notes (Signed)
New Patient Office Visit  Subjective    Patient ID: Cheryl Shaw, female    DOB: 2001/02/27  Age: 22 y.o. MRN: [id number]  CC:  Chief Complaint  Patient presents with   Establish Care   Tinnitus    Pt says since having an infected cyst moved in urgent care, she started noticing the ringing in her left ear. She says that it comes and goes, and sometimes causes headache.     HPI Cheryl Shaw presents to establish care. Pt is new to me. She moved from [place].   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  She reports a month ago, she got a cyst drained from her ear at Urgent care. She says in the last few weeks, she has been having ringing in her left ear. She says it happens sporadically and lasts a minute or 2.  She says she now has headaches every other day. No pain to the left ear. Can lay on it just fine. She does report going to the gym and wearing her ear buds. She says it's kind of loud. She also may have headaches and takes Ibuprofen '200mg'$  x 2 for this.  She just got her wisdom teeth removed last Thursday. Taking PCN and Norco prn for pain.   No allergies. LMP Feb 10th She has OCPs that she says she doesn't take regularly anymore. She started due to heavy menses but now are regular. She is sexually active with female partner. She hasn't been taking the Zoloft in the last month. She was started on this a year ago for anxiety. She feels like her anxiety is manageable.    Outpatient Encounter Medications as of 08/02/2022  Medication Sig   HYDROcodone-acetaminophen (NORCO/VICODIN) 5-325 MG tablet Take 1 tablet by mouth every 4 (four) hours as needed.   penicillin v potassium (VEETID) 500 MG tablet Take 500 mg by mouth 4 (four) times daily.   sertraline (ZOLOFT) 50 MG tablet Take 50 mg by mouth daily.   [DISCONTINUED] LO LOESTRIN FE 1 MG-10 MCG / 10 MCG tablet Take 1 tablet by mouth daily.   [DISCONTINUED] doxycycline (VIBRAMYCIN) 100 MG capsule Take 1 capsule (100 mg total) by  mouth 2 (two) times daily.   [DISCONTINUED] metroNIDAZOLE (FLAGYL) 500 MG tablet Take 1 tablet (500 mg total) by mouth 2 (two) times daily.   [DISCONTINUED] naproxen (NAPROSYN) 500 MG tablet Take 1 tablet (500 mg total) by mouth 2 (two) times daily with a meal.   [DISCONTINUED] terconazole (TERAZOL 7) 0.4 % vaginal cream Apply twice daily to vulvovaginal area, can reapply after every void, use for 7 days as needed   No facility-administered encounter medications on file as of 08/02/2022.    History reviewed. No pertinent past medical history.  Past Surgical History:  Procedure Laterality Date   WISDOM TOOTH EXTRACTION Bilateral 07/29/2022    Family History  Problem Relation Age of Onset   Hypertension Mother     Social History   Socioeconomic History   Marital status: Single    Spouse name: Not on file   Number of children: Not on file   Years of education: Not on file   Highest education level: Not on file  Occupational History   Occupation: Student [employer] Senior 2024  Tobacco Use   Smoking status: Never   Smokeless tobacco: Never  Vaping Use   Vaping Use: Never used  Substance and Sexual Activity   Alcohol use: Yes    Comment: occ  Drug use: Never   Sexual activity: Yes    Partners: Female    Birth control/protection: Pill  Other Topics Concern   Not on file  Social History Narrative   Not on file   Social Determinants of Health   Financial Resource Strain: Not on file  Food Insecurity: Not on file  Transportation Needs: Not on file  Physical Activity: Not on file  Stress: Not on file  Social Connections: Not on file  Intimate Partner Violence: Not on file    Review of Systems  HENT:  Positive for tinnitus. Negative for congestion, ear discharge, ear pain, hearing loss and sinus pain.   All other systems reviewed and are negative.       Objective    BP 117/73   Pulse 73   Temp 98.3 F (36.8 C) (Oral)   Ht '5\' 5"'$  (1.651 m)   Wt 160 lb 12.8 oz  (72.9 kg)   SpO2 100%   BMI 26.76 kg/m   Physical Exam Vitals and nursing note reviewed.  Constitutional:      Appearance: Normal appearance. She is normal weight.  HENT:     Head: Normocephalic and atraumatic.     Right Ear: Tympanic membrane, ear canal and external ear normal.     Left Ear: Tympanic membrane, ear canal and external ear normal.     Nose: Nose normal.     Mouth/Throat:     Mouth: Mucous membranes are moist.  Eyes:     Pupils: Pupils are equal, round, and reactive to light.  Cardiovascular:     Rate and Rhythm: Normal rate and regular rhythm.     Pulses: Normal pulses.     Heart sounds: Normal heart sounds.  Pulmonary:     Effort: Pulmonary effort is normal.     Breath sounds: Normal breath sounds.  Abdominal:     General: Abdomen is flat.  Skin:    General: Skin is warm.     Capillary Refill: Capillary refill takes less than 2 seconds.  Neurological:     General: No focal deficit present.     Mental Status: She is alert and oriented to person, place, and time. Mental status is at baseline.  Psychiatric:        Mood and Affect: Mood normal.        Behavior: Behavior normal.        Thought Content: Thought content normal.        Judgment: Judgment normal.        Assessment & Plan:   Problem List Items Addressed This Visit   None Visit Diagnoses     Encounter to establish care with new doctor    -  Primary   Tinnitus of left ear          Discussed possible causes of tinnitus to include loud music and nsaid use. To avoid Ibuprofen and use tylenol prn for headaches. Also avoid loud music exposure.  To see back in 5 weeks for physical with pap.   Return in about 5 weeks (around 09/06/2022) for Annual Physical with Pap.   Cheryl Rio, MD

## 2022-08-03 NOTE — Telephone Encounter (Signed)
FMLA paperwork has been completed. Please fax to [facility] and have scanned to pt's chart. Pt will need to return to pay fee and can pick up a copy.

## 2022-08-03 NOTE — Telephone Encounter (Signed)
Payment completed over the phone. Copy made and attached for patient pickup.  Original faxed to location indicated on FMLA paperwork and sent for scanning into chart.   Cheryl Shaw
